# Patient Record
Sex: Female | Born: 1995 | Race: White | Hispanic: No | Marital: Single | Smoking: Never smoker
Health system: Southern US, Community
[De-identification: ages and names within clinical notes are randomized; demographics above are authoritative.]

## PROBLEM LIST (undated history)

## (undated) DIAGNOSIS — C801 Malignant (primary) neoplasm, unspecified: Secondary | ICD-10-CM

---
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
## 2014-02-14 ENCOUNTER — Emergency Department: Payer: Self-pay | Admitting: Emergency Medicine

## 2014-02-14 LAB — CBC WITH DIFFERENTIAL/PLATELET
BASOS ABS: 0.1 10*3/uL (ref 0.0–0.1)
BASOS PCT: 0.5 %
Eosinophil #: 0.1 10*3/uL (ref 0.0–0.7)
Eosinophil %: 0.7 %
HCT: 45.6 % (ref 35.0–47.0)
HGB: 14.6 g/dL (ref 12.0–16.0)
LYMPHS ABS: 1.5 10*3/uL (ref 1.0–3.6)
LYMPHS PCT: 12.7 %
MCH: 27.8 pg (ref 26.0–34.0)
MCHC: 31.9 g/dL — ABNORMAL LOW (ref 32.0–36.0)
MCV: 87 fL (ref 80–100)
MONO ABS: 0.9 x10 3/mm (ref 0.2–0.9)
MONOS PCT: 8.1 %
NEUTROS ABS: 9 10*3/uL — AB (ref 1.4–6.5)
NEUTROS PCT: 78 %
Platelet: 265 10*3/uL (ref 150–440)
RBC: 5.23 10*6/uL — AB (ref 3.80–5.20)
RDW: 12.5 % (ref 11.5–14.5)
WBC: 11.5 10*3/uL — AB (ref 3.6–11.0)

## 2014-02-14 LAB — TSH: THYROID STIMULATING HORM: 1.26 u[IU]/mL

## 2014-02-14 LAB — MONONUCLEOSIS SCREEN: Mono Test: POSITIVE

## 2014-02-17 LAB — BETA STREP CULTURE(ARMC)

## 2020-05-17 ENCOUNTER — Other Ambulatory Visit: Payer: Self-pay

## 2020-05-18 LAB — CBC WITH DIFFERENTIAL/PLATELET
Basophils Absolute: 0.1 10*3/uL (ref 0.0–0.2)
Basos: 1 %
EOS (ABSOLUTE): 0 10*3/uL (ref 0.0–0.4)
Eos: 0 %
Hematocrit: 43.1 % (ref 34.0–46.6)
Hemoglobin: 14.1 g/dL (ref 11.1–15.9)
Immature Grans (Abs): 0 10*3/uL (ref 0.0–0.1)
Immature Granulocytes: 0 %
Lymphocytes Absolute: 2.3 10*3/uL (ref 0.7–3.1)
Lymphs: 27 %
MCH: 30.2 pg (ref 26.6–33.0)
MCHC: 32.7 g/dL (ref 31.5–35.7)
MCV: 92 fL (ref 79–97)
Monocytes Absolute: 0.5 10*3/uL (ref 0.1–0.9)
Monocytes: 6 %
Neutrophils Absolute: 5.7 10*3/uL (ref 1.4–7.0)
Neutrophils: 66 %
Platelets: 234 10*3/uL (ref 150–450)
RBC: 4.67 x10E6/uL (ref 3.77–5.28)
RDW: 11.9 % (ref 11.7–15.4)
WBC: 8.6 10*3/uL (ref 3.4–10.8)

## 2020-09-06 ENCOUNTER — Emergency Department
Admission: EM | Admit: 2020-09-06 | Discharge: 2020-09-06 | Disposition: A | Discharge status: Home / Self Care | Payer: Managed Care, Other (non HMO) | Attending: Emergency Medicine | Admitting: Emergency Medicine

## 2020-09-06 ENCOUNTER — Emergency Department: Payer: Managed Care, Other (non HMO)

## 2020-09-06 ENCOUNTER — Encounter: Payer: Self-pay | Admitting: Emergency Medicine

## 2020-09-06 ENCOUNTER — Other Ambulatory Visit: Payer: Self-pay

## 2020-09-06 DIAGNOSIS — Z859 Personal history of malignant neoplasm, unspecified: Secondary | ICD-10-CM | POA: Insufficient documentation

## 2020-09-06 DIAGNOSIS — U071 COVID-19: Secondary | ICD-10-CM | POA: Insufficient documentation

## 2020-09-06 DIAGNOSIS — M545 Low back pain, unspecified: Secondary | ICD-10-CM

## 2020-09-06 DIAGNOSIS — M549 Dorsalgia, unspecified: Secondary | ICD-10-CM | POA: Diagnosis present

## 2020-09-06 HISTORY — DX: Malignant (primary) neoplasm, unspecified: C80.1

## 2020-09-06 LAB — URINALYSIS, COMPLETE (UACMP) WITH MICROSCOPIC
Bacteria, UA: NONE SEEN
Bilirubin Urine: NEGATIVE
Glucose, UA: NEGATIVE mg/dL
Hgb urine dipstick: NEGATIVE
Ketones, ur: 5 mg/dL — AB
Leukocytes,Ua: NEGATIVE
Nitrite: NEGATIVE
Protein, ur: NEGATIVE mg/dL
Specific Gravity, Urine: 1.012 (ref 1.005–1.030)
Squamous Epithelial / HPF: NONE SEEN (ref 0–5)
pH: 5 (ref 5.0–8.0)

## 2020-09-06 LAB — POC URINE PREG, ED: Preg Test, Ur: NEGATIVE

## 2020-09-06 LAB — RESP PANEL BY RT-PCR (FLU A&B, COVID) ARPGX2
Influenza A by PCR: NEGATIVE
Influenza B by PCR: NEGATIVE
SARS Coronavirus 2 by RT PCR: POSITIVE — AB

## 2020-09-06 MED ORDER — METHOCARBAMOL 750 MG PO TABS: 750.0000 mg | ORAL_TABLET | Freq: Four times a day (QID) | ORAL | 0 refills | Status: AC | PRN

## 2020-09-06 MED ORDER — KETOROLAC TROMETHAMINE 60 MG/2ML IM SOLN
30.0000 mg | Freq: Once | INTRAMUSCULAR | Status: AC
  Administered 2020-09-06: 30 mg via INTRAMUSCULAR
  Filled 2020-09-06: qty 2

## 2020-09-06 MED ORDER — METHOCARBAMOL 500 MG PO TABS
750.0000 mg | ORAL_TABLET | Freq: Once | ORAL | Status: AC
  Administered 2020-09-06: 750 mg via ORAL
  Filled 2020-09-06: qty 2

## 2020-09-06 MED ORDER — MELOXICAM 15 MG PO TABS: 15.0000 mg | ORAL_TABLET | Freq: Every day | ORAL | 0 refills | Status: AC

## 2020-09-06 MED ORDER — IBUPROFEN 800 MG PO TABS
800.0000 mg | ORAL_TABLET | Freq: Once | ORAL | Status: AC
  Administered 2020-09-06: 800 mg via ORAL

## 2020-09-06 MED ORDER — ACETAMINOPHEN 325 MG PO TABS
650.0000 mg | ORAL_TABLET | Freq: Once | ORAL | Status: AC
  Administered 2020-09-06: 650 mg via ORAL
  Filled 2020-09-06: qty 2

## 2020-09-06 NOTE — ED Notes (Signed)
POC was NEGATIVE.

## 2020-09-06 NOTE — ED Provider Notes (Signed)
[facility] Emergency Department Provider Note  ____________________________________________   Event Date/Time   First MD Initiated Contact with Patient 09/06/20 1927     (approximate)  I have reviewed the triage vital signs and the nursing notes.   HISTORY  Chief Complaint Back Pain  HPI Brandi Hickman is a 25 y.o. female who presents to the emergency department today for evaluation of acute back pain that began last week.  She does not recall any injury but does lift heavy at work. Denies any dysuria but states she has had some intermittent nausea she thinks is related to severe pain. She denies abdominal pain, recent fevers, loss of bowel or bladder control. Has been ambulatory and denies any radiation to the extremities.        Past Medical History:  Diagnosis Date   Cancer ([place])     There are no problems to display for this patient.   Past Surgical History:  Procedure Laterality Date   THYROIDECTOMY, PARTIAL      Prior to Admission medications   Medication Sig Start Date End Date Taking? Authorizing Provider  meloxicam (MOBIC) 15 MG tablet Take 1 tablet (15 mg total) by mouth daily for 15 days. 09/06/20 09/21/20 Yes Bradon Fester, Farrel Gordon, PA  methocarbamol (ROBAXIN-750) 750 MG tablet Take 1 tablet (750 mg total) by mouth 4 (four) times daily as needed for up to 10 days for muscle spasms. 09/06/20 09/16/20 Yes Marlana Salvage, PA    Allergies Erythromycin  No family history on file.  Social History Social History   Tobacco Use   Smoking status: Never  Substance Use Topics   Alcohol use: Not Currently    Review of Systems Constitutional: No fever/chills Eyes: No visual changes. ENT: No sore throat. Cardiovascular: Denies chest pain. Respiratory: Denies shortness of breath. Gastrointestinal: No abdominal pain.  No nausea, no vomiting.  No diarrhea.  No constipation. Genitourinary: Negative for dysuria. Musculoskeletal: + back  pain Skin: Negative for rash. Neurological: Negative for headaches, focal weakness or numbness.  ____________________________________________   PHYSICAL EXAM:  VITAL SIGNS: ED Triage Vitals  Enc Vitals Group     BP 09/06/20 1837 136/70     Pulse Rate 09/06/20 1837 (!) 114     Resp 09/06/20 1837 18     Temp 09/06/20 1837 99.3 F (37.4 C)     Temp Source 09/06/20 1837 Oral     SpO2 09/06/20 1837 100 %     Weight 09/06/20 1835 135 lb (61.2 kg)     Height 09/06/20 1835 5\' 3"  (1.6 m)     Head Circumference --      Peak Flow --      Pain Score 09/06/20 1834 10     Pain Loc --      Pain Edu? --      Excl. in [place]? --    Constitutional: Alert and oriented. Well appearing and in no acute distress. Eyes: Conjunctivae are normal. PERRL. EOMI. Head: Atraumatic. Nose: No congestion/rhinnorhea. Mouth/Throat: Mucous membranes are moist.  Oropharynx non-erythematous. Neck: No stridor.   Cardiovascular: Normal rate, regular rhythm. Grossly normal heart sounds.  Good peripheral circulation. Respiratory: Normal respiratory effort.  No retractions. Lungs CTAB. Gastrointestinal: Soft and nontender. No distention. No abdominal bruits. No CVA tenderness. Musculoskeletal: NO midline tenderness. No left sided tenderness. There is right sided tenderness present. Neurologic:  Normal speech and language. No gross focal neurologic deficits are appreciated. No gait instability. Skin:  Skin is warm, dry  and intact. No rash noted. Psychiatric: Mood and affect are normal. Speech and behavior are normal.  ____________________________________________   LABS (all labs ordered are listed, but only abnormal results are displayed)  Labs Reviewed  URINALYSIS, COMPLETE (UACMP) WITH MICROSCOPIC - Abnormal; Notable for the following components:      Result Value   Color, Urine YELLOW (*)    APPearance CLEAR (*)    Ketones, ur 5 (*)    All other components within normal limits  RESP PANEL BY RT-PCR (FLU A&B,  COVID) ARPGX2  POC URINE PREG, ED    ____________________________________________  RADIOLOGY Lenoria Farrier, personally viewed and evaluated these images (plain radiographs) as part of my medical decision making, as well as reviewing the written report by the radiologist.  ED provider interpretation:  no acute abnormalities noted on lumbar xr  Official radiology report(s): DG Lumbar Spine 2-3 Views  Result Date: 09/06/2020 CLINICAL DATA:  25 year old female with low back pain. EXAM: LUMBAR SPINE - 2-3 VIEW COMPARISON:  None. FINDINGS: Five lumbar type vertebra. There is no acute fracture or subluxation of the lumbar spine. The vertebral body heights and disc spaces are maintained. The visualized posterior elements are intact. The neural foramina are patent. The soft tissues are unremarkable. IMPRESSION: Negative. Electronically Signed   By: Anner Crete M.D.   On: 09/06/2020 21:24    ____________________________________________   INITIAL IMPRESSION / ASSESSMENT AND PLAN / ED COURSE  As part of my medical decision making, I reviewed the following data within the [place] notes reviewed and incorporated, Labs reviewed, Radiograph reviewed, and Notes from prior ED visits        Patient is a 25 year old who presents to the emergency department for evaluation of left-sided back pain for about 1 week.  See HPI for further details.  In triage, patient was mildly tachycardic but otherwise had normal vitals.  Recheck of those in the room renders no persistent tachycardia.  On exam patient is tender to the musculature of the low back without midline tenderness.  No CVA tenderness.  Urinalysis does not reveal any evidence of UTI.  POC is negative.  X-Mervine was obtained and is negative.  We will treat for musculoskeletal back pain with Toradol, muscle relaxant and Tylenol.  Patient is reporting significant improvement in her pain after Toradol.  Patient is stable at  this time for outpatient follow-up.  Return precautions discussed.      ____________________________________________   FINAL CLINICAL IMPRESSION(S) / ED DIAGNOSES  Final diagnoses:  Acute bilateral low back pain without sciatica     ED Discharge Orders          Ordered    methocarbamol (ROBAXIN-750) 750 MG tablet  4 times daily PRN        09/06/20 2152    meloxicam (MOBIC) 15 MG tablet  Daily        09/06/20 2152             Note:  This document was prepared using Dragon voice recognition software and may include unintentional dictation errors.    Marlana Salvage, PA 09/06/20 2251    Delman Kitten, MD 09/10/20 [phone]

## 2020-09-06 NOTE — ED Notes (Signed)
Discharge instructions reviewed with pt, pt calm , collective , denied pain or sob.  

## 2020-09-06 NOTE — ED Triage Notes (Signed)
C/O lower back pain.  STates has chronic back pain.  Pain initially started Friday, took advil and applied heat, pain improved.  States pain returned today, worse.

## 2020-09-06 NOTE — Discharge Instructions (Addendum)
  Take medications as prescribed. You may also take Tylenol, 1000mg  up to 4x daily. Follow up with primary care if no improvement or return to the ER with any worsening.

## 2022-09-23 IMAGING — CR DG LUMBAR SPINE 2-3V
3 series · 3 of 3 positions shown · non-contrast
Comparison: None.

CLINICAL DATA: 24-year-old female with low back pain.

EXAM:
LUMBAR SPINE - 2-3 VIEW

[l-spine ap]
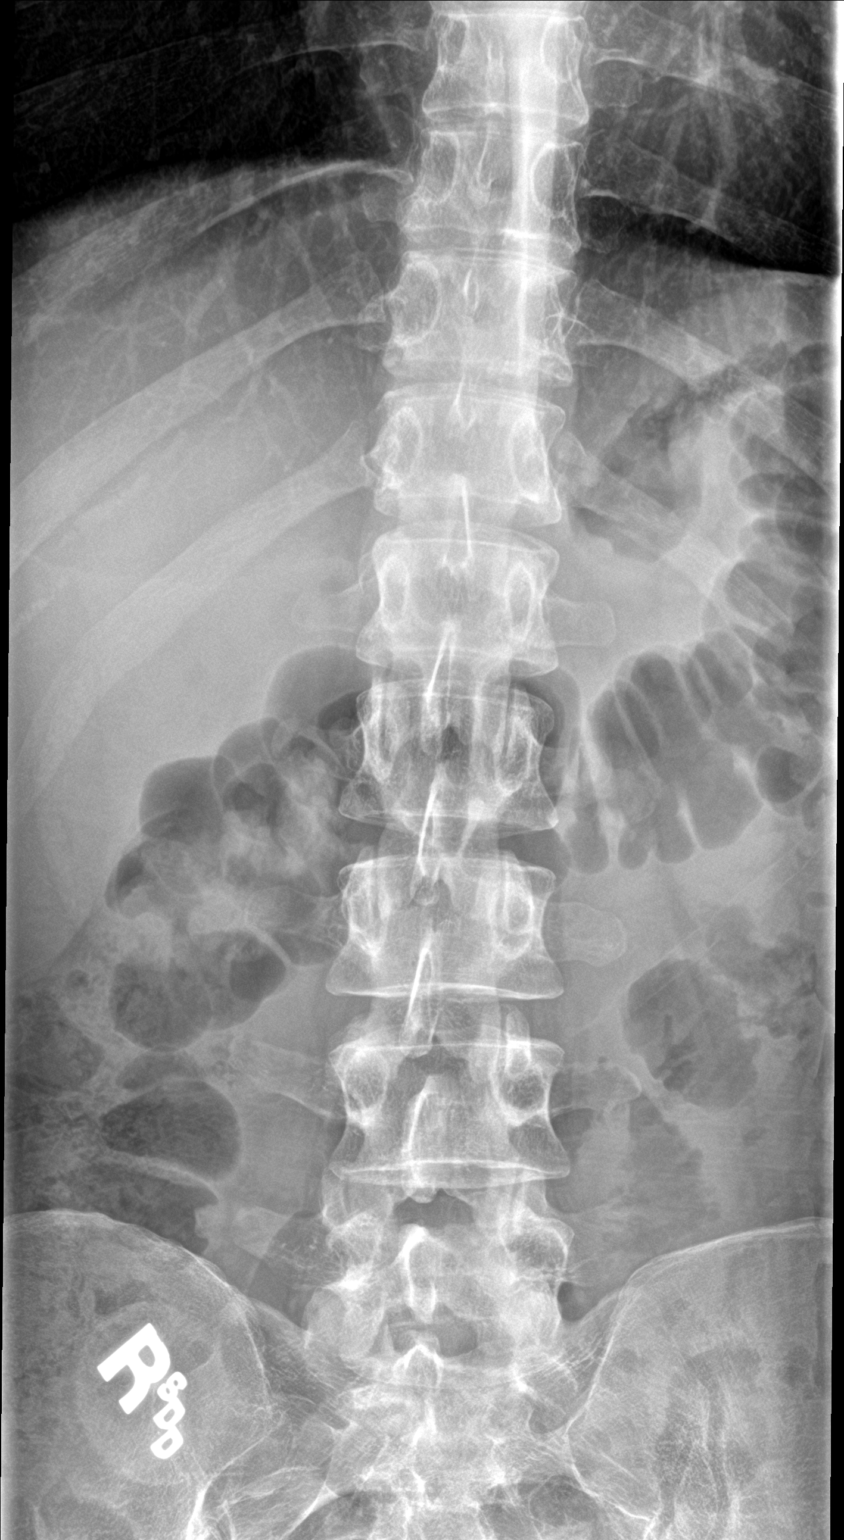

[l-spine lat]
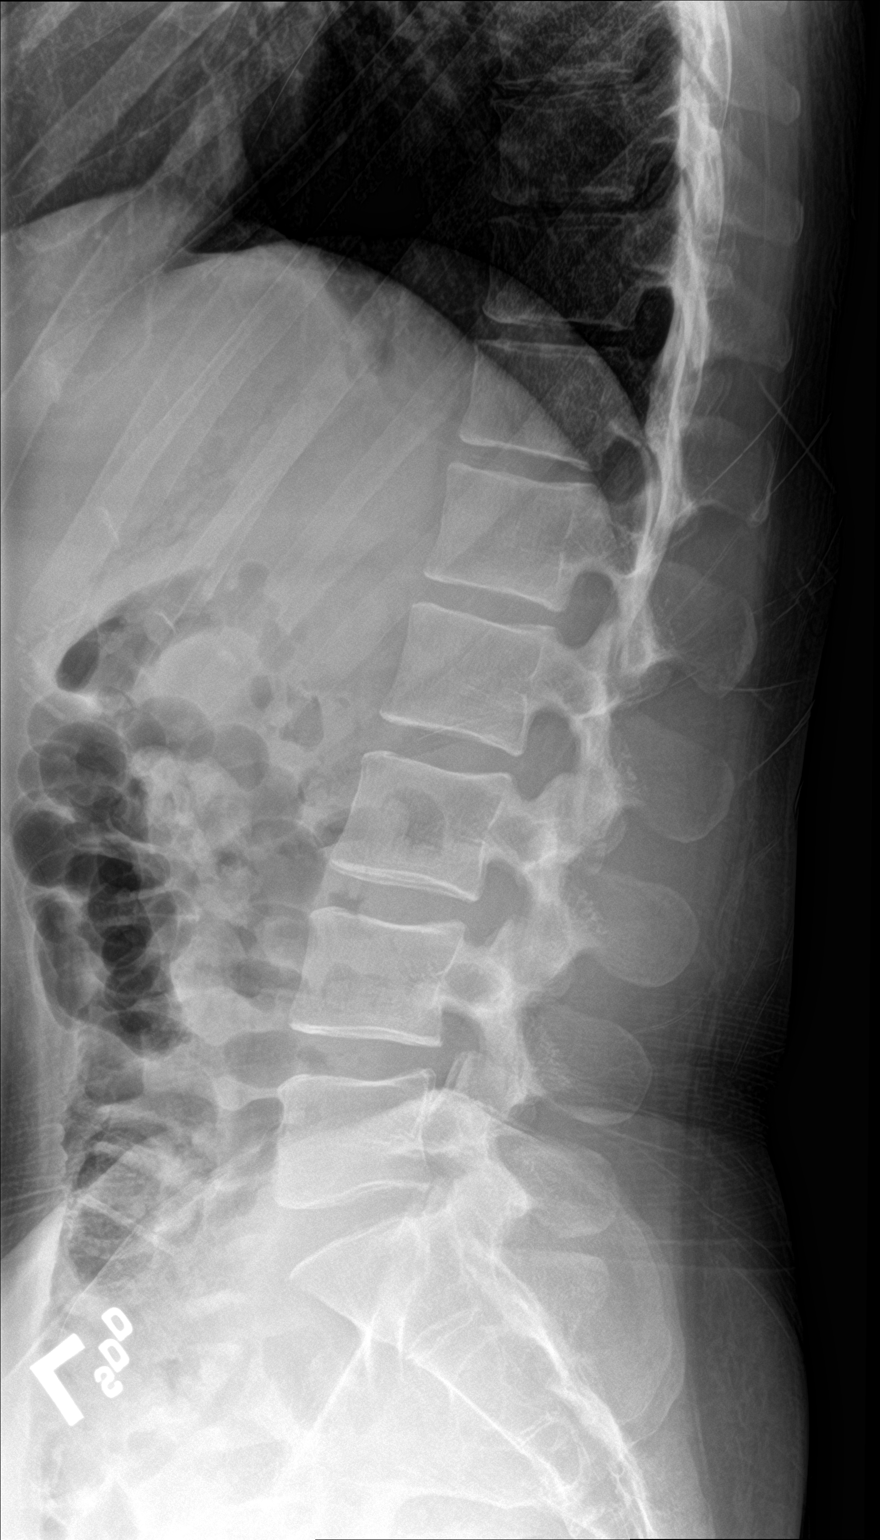

[l-spine spot]
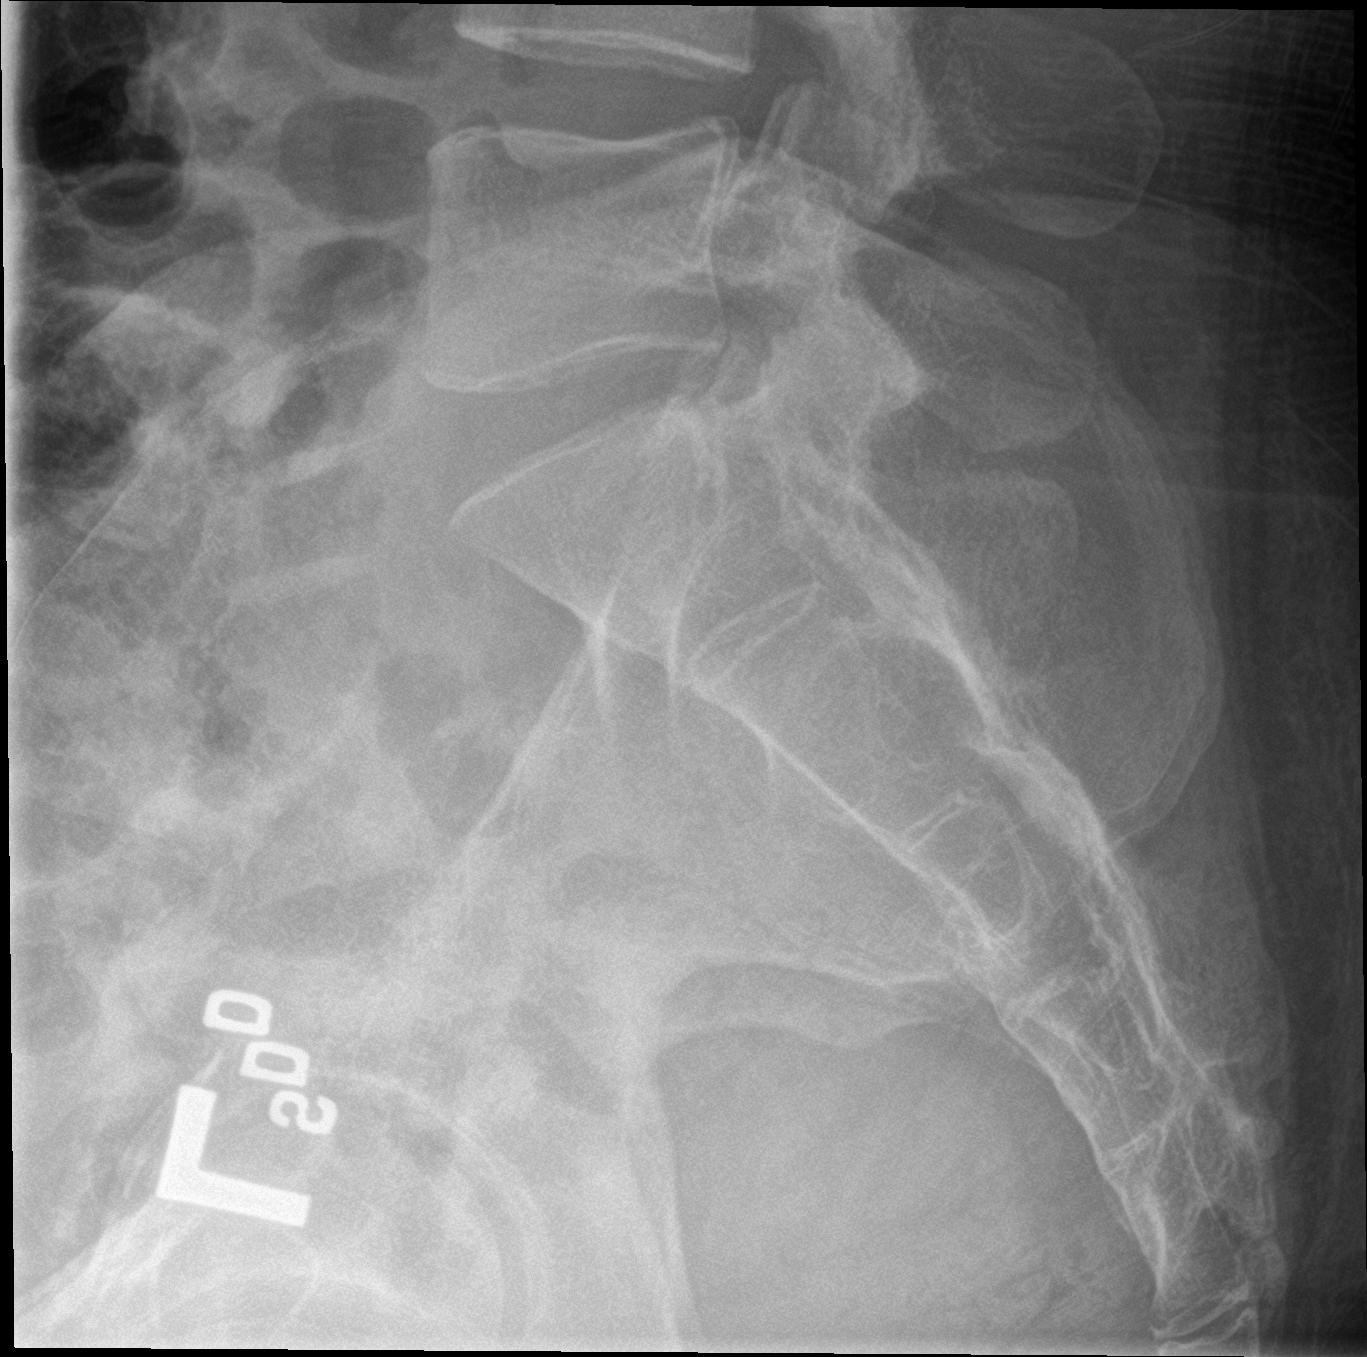

[3 of 3 positions shown; findings below may reference images not displayed]

FINDINGS: Five lumbar type vertebra. There is no acute fracture or subluxation
of the lumbar spine. The vertebral body heights and disc spaces are
maintained. The visualized posterior elements are intact. The neural
foramina are patent. The soft tissues are unremarkable.
IMPRESSION: Negative.
